# Patient Record
Sex: Male | Born: 2000 | Race: White | Hispanic: No | Marital: Single | Smoking: Never smoker
Health system: Southern US, Community
[De-identification: ages and names within clinical notes are randomized; demographics above are authoritative.]

## PROBLEM LIST (undated history)

## (undated) DIAGNOSIS — J45909 Unspecified asthma, uncomplicated: Secondary | ICD-10-CM

## (undated) HISTORY — PX: TONSILLECTOMY: SUR1361

---
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
## 2021-01-12 DIAGNOSIS — Z5181 Encounter for therapeutic drug level monitoring: Secondary | ICD-10-CM | POA: Diagnosis not present

## 2021-01-12 DIAGNOSIS — U071 COVID-19: Secondary | ICD-10-CM | POA: Diagnosis not present

## 2021-01-12 DIAGNOSIS — Z20828 Contact with and (suspected) exposure to other viral communicable diseases: Secondary | ICD-10-CM | POA: Diagnosis not present

## 2021-01-12 DIAGNOSIS — Z79899 Other long term (current) drug therapy: Secondary | ICD-10-CM | POA: Diagnosis not present

## 2021-01-12 DIAGNOSIS — Z0100 Encounter for examination of eyes and vision without abnormal findings: Secondary | ICD-10-CM | POA: Diagnosis not present

## 2021-01-19 DIAGNOSIS — K51011 Ulcerative (chronic) pancolitis with rectal bleeding: Secondary | ICD-10-CM | POA: Diagnosis not present

## 2021-02-02 DIAGNOSIS — K51011 Ulcerative (chronic) pancolitis with rectal bleeding: Secondary | ICD-10-CM | POA: Diagnosis not present

## 2021-02-07 DIAGNOSIS — K51011 Ulcerative (chronic) pancolitis with rectal bleeding: Secondary | ICD-10-CM | POA: Diagnosis not present

## 2021-02-07 DIAGNOSIS — K529 Noninfective gastroenteritis and colitis, unspecified: Secondary | ICD-10-CM | POA: Diagnosis not present

## 2021-03-02 DIAGNOSIS — K51011 Ulcerative (chronic) pancolitis with rectal bleeding: Secondary | ICD-10-CM | POA: Diagnosis not present

## 2021-03-23 ENCOUNTER — Other Ambulatory Visit (HOSPITAL_COMMUNITY): Payer: Self-pay

## 2021-03-23 DIAGNOSIS — U071 COVID-19: Secondary | ICD-10-CM | POA: Diagnosis not present

## 2021-03-23 DIAGNOSIS — Z20828 Contact with and (suspected) exposure to other viral communicable diseases: Secondary | ICD-10-CM | POA: Diagnosis not present

## 2021-03-23 DIAGNOSIS — Z1159 Encounter for screening for other viral diseases: Secondary | ICD-10-CM | POA: Diagnosis not present

## 2021-03-23 MED ORDER — XOFLUZA (80 MG DOSE) 1 X 80 MG PO TBPK
80.0000 mg | ORAL_TABLET | Freq: Once | ORAL | 0 refills | Status: AC
  Filled 2021-03-23: qty 1, 1d supply, fill #0

## 2021-03-30 DIAGNOSIS — R197 Diarrhea, unspecified: Secondary | ICD-10-CM | POA: Diagnosis not present

## 2021-03-30 DIAGNOSIS — Z68.41 Body mass index (BMI) pediatric, less than 5th percentile for age: Secondary | ICD-10-CM | POA: Diagnosis not present

## 2021-03-30 DIAGNOSIS — K51011 Ulcerative (chronic) pancolitis with rectal bleeding: Secondary | ICD-10-CM | POA: Diagnosis not present

## 2021-04-14 ENCOUNTER — Other Ambulatory Visit (HOSPITAL_COMMUNITY): Payer: Self-pay

## 2021-04-14 DIAGNOSIS — K51011 Ulcerative (chronic) pancolitis with rectal bleeding: Secondary | ICD-10-CM | POA: Diagnosis not present

## 2021-04-14 MED ORDER — AZATHIOPRINE 50 MG PO TABS
50.0000 mg | ORAL_TABLET | Freq: Every day | ORAL | 11 refills | Status: AC
  Filled 2021-04-14: qty 30, 30d supply, fill #0

## 2021-04-19 ENCOUNTER — Other Ambulatory Visit (HOSPITAL_COMMUNITY): Payer: Self-pay

## 2021-04-27 DIAGNOSIS — K51011 Ulcerative (chronic) pancolitis with rectal bleeding: Secondary | ICD-10-CM | POA: Diagnosis not present

## 2021-05-09 DIAGNOSIS — K51011 Ulcerative (chronic) pancolitis with rectal bleeding: Secondary | ICD-10-CM | POA: Diagnosis not present

## 2021-07-07 ENCOUNTER — Other Ambulatory Visit (HOSPITAL_COMMUNITY): Payer: Self-pay

## 2021-07-07 DIAGNOSIS — K51011 Ulcerative (chronic) pancolitis with rectal bleeding: Secondary | ICD-10-CM | POA: Diagnosis not present

## 2021-07-07 DIAGNOSIS — K51019 Ulcerative (chronic) pancolitis with unspecified complications: Secondary | ICD-10-CM | POA: Diagnosis not present

## 2021-07-07 MED ORDER — LOPERAMIDE HCL 2 MG PO CAPS
2.0000 mg | ORAL_CAPSULE | Freq: Four times a day (QID) | ORAL | 0 refills | Status: AC | PRN
  Filled 2021-07-07: qty 30, 8d supply, fill #0

## 2021-07-26 ENCOUNTER — Other Ambulatory Visit (HOSPITAL_COMMUNITY): Payer: Self-pay

## 2021-11-17 ENCOUNTER — Other Ambulatory Visit (HOSPITAL_COMMUNITY): Payer: Self-pay

## 2021-11-17 MED ORDER — BUDESONIDE ER 9 MG PO TB24
9.0000 mg | ORAL_TABLET | Freq: Every day | ORAL | 0 refills | Status: AC
  Filled 2021-11-17: qty 30, 30d supply, fill #0

## 2021-11-20 ENCOUNTER — Other Ambulatory Visit (HOSPITAL_COMMUNITY): Payer: Self-pay

## 2022-04-02 ENCOUNTER — Other Ambulatory Visit: Payer: Self-pay | Admitting: Family

## 2022-04-02 DIAGNOSIS — M25552 Pain in left hip: Secondary | ICD-10-CM

## 2022-04-02 DIAGNOSIS — M1228 Villonodular synovitis (pigmented), other specified site: Secondary | ICD-10-CM

## 2022-04-16 ENCOUNTER — Inpatient Hospital Stay: Admission: RE | Admit: 2022-04-16 | Payer: 59 | Source: Ambulatory Visit

## 2022-05-12 IMAGING — CR DG FOOT COMPLETE 3+V*R*
3 series · 3 of 3 positions shown · non-contrast
Comparison: None.

CLINICAL DATA: 19-year-old male with right foot pain.

EXAM:
RIGHT FOOT COMPLETE - 3+ VIEW

[t foot ap right]
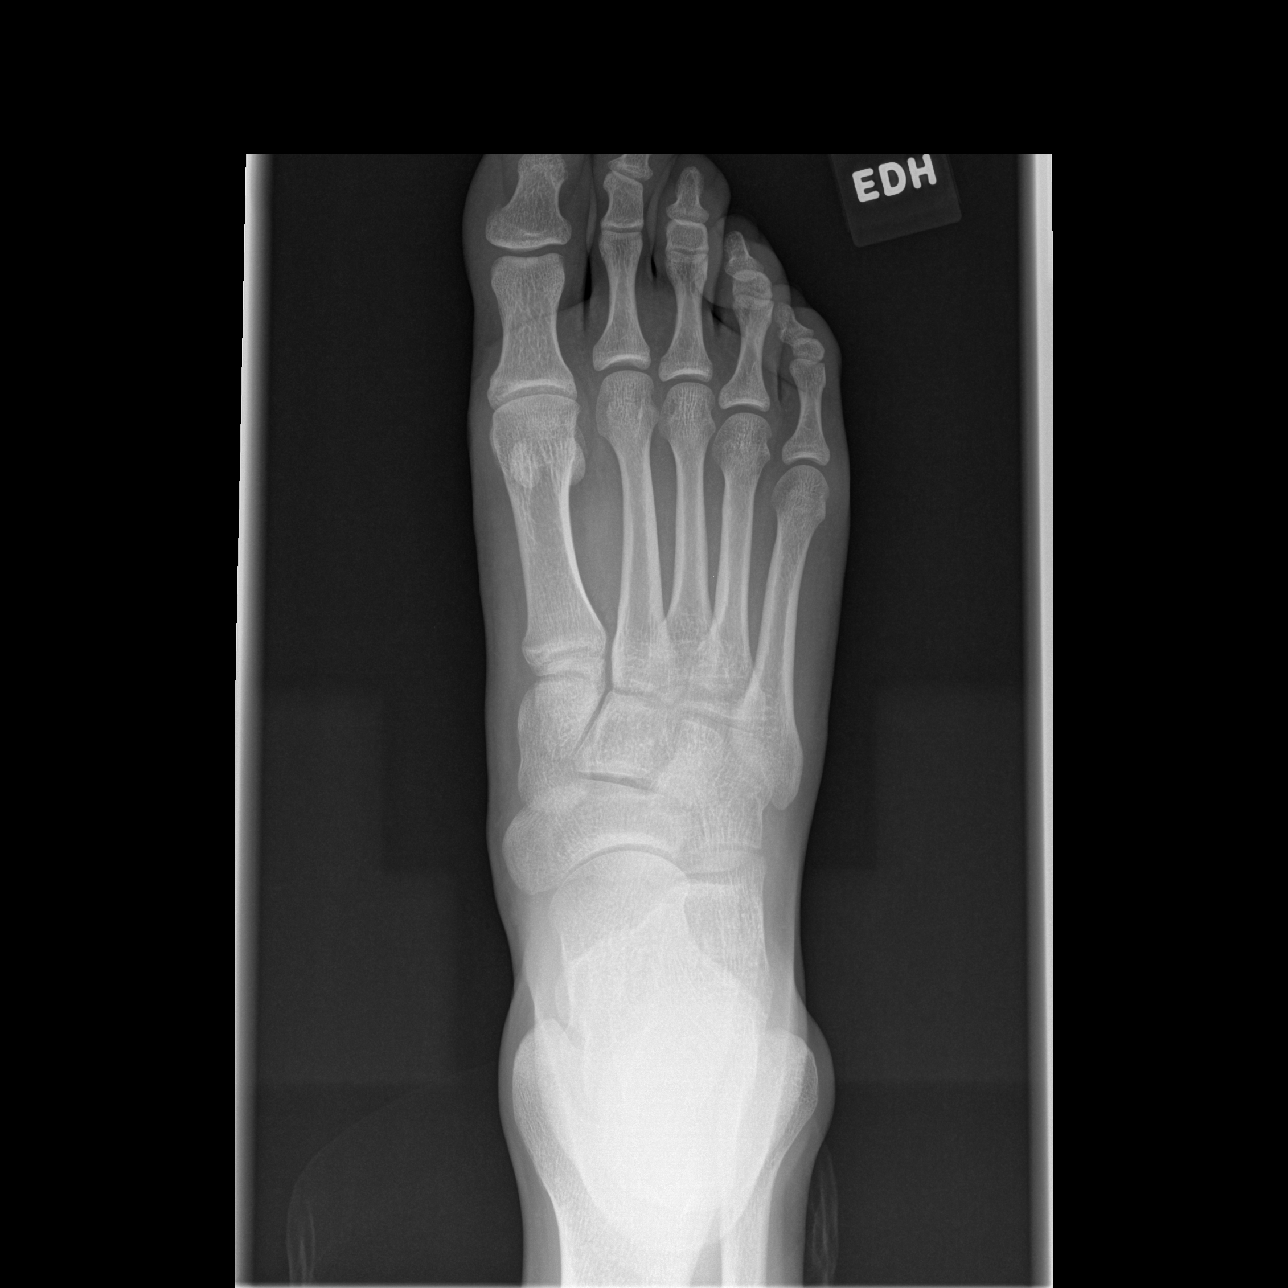

[t foot oblique right]
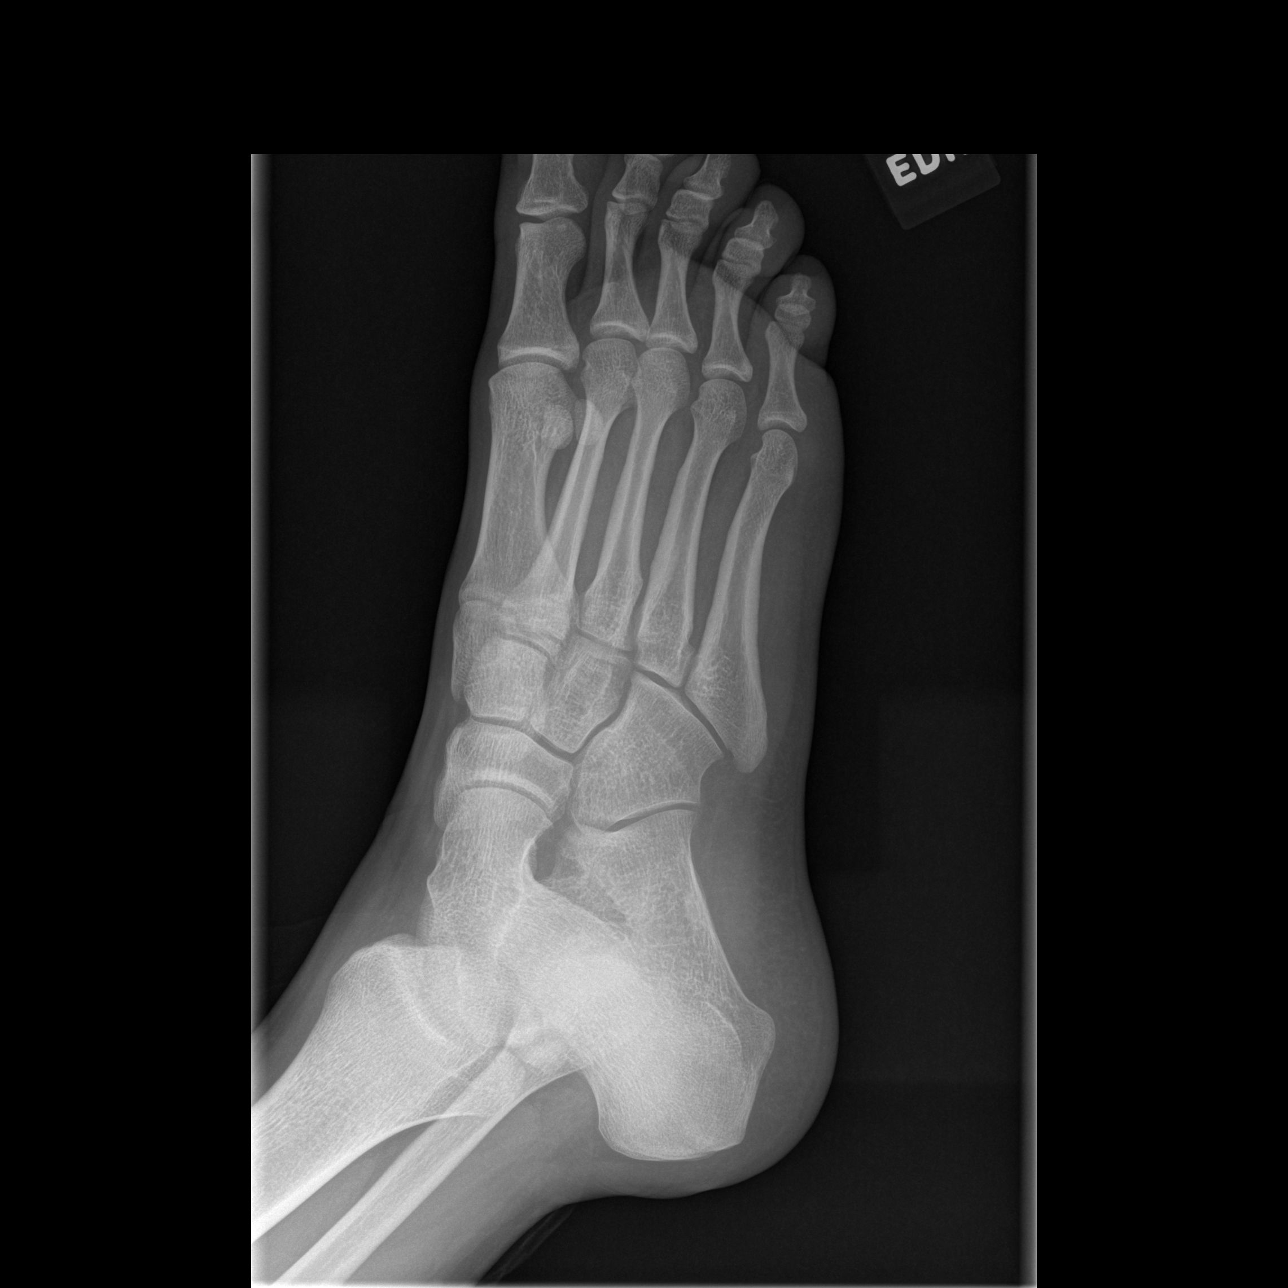

[t foot lat right]
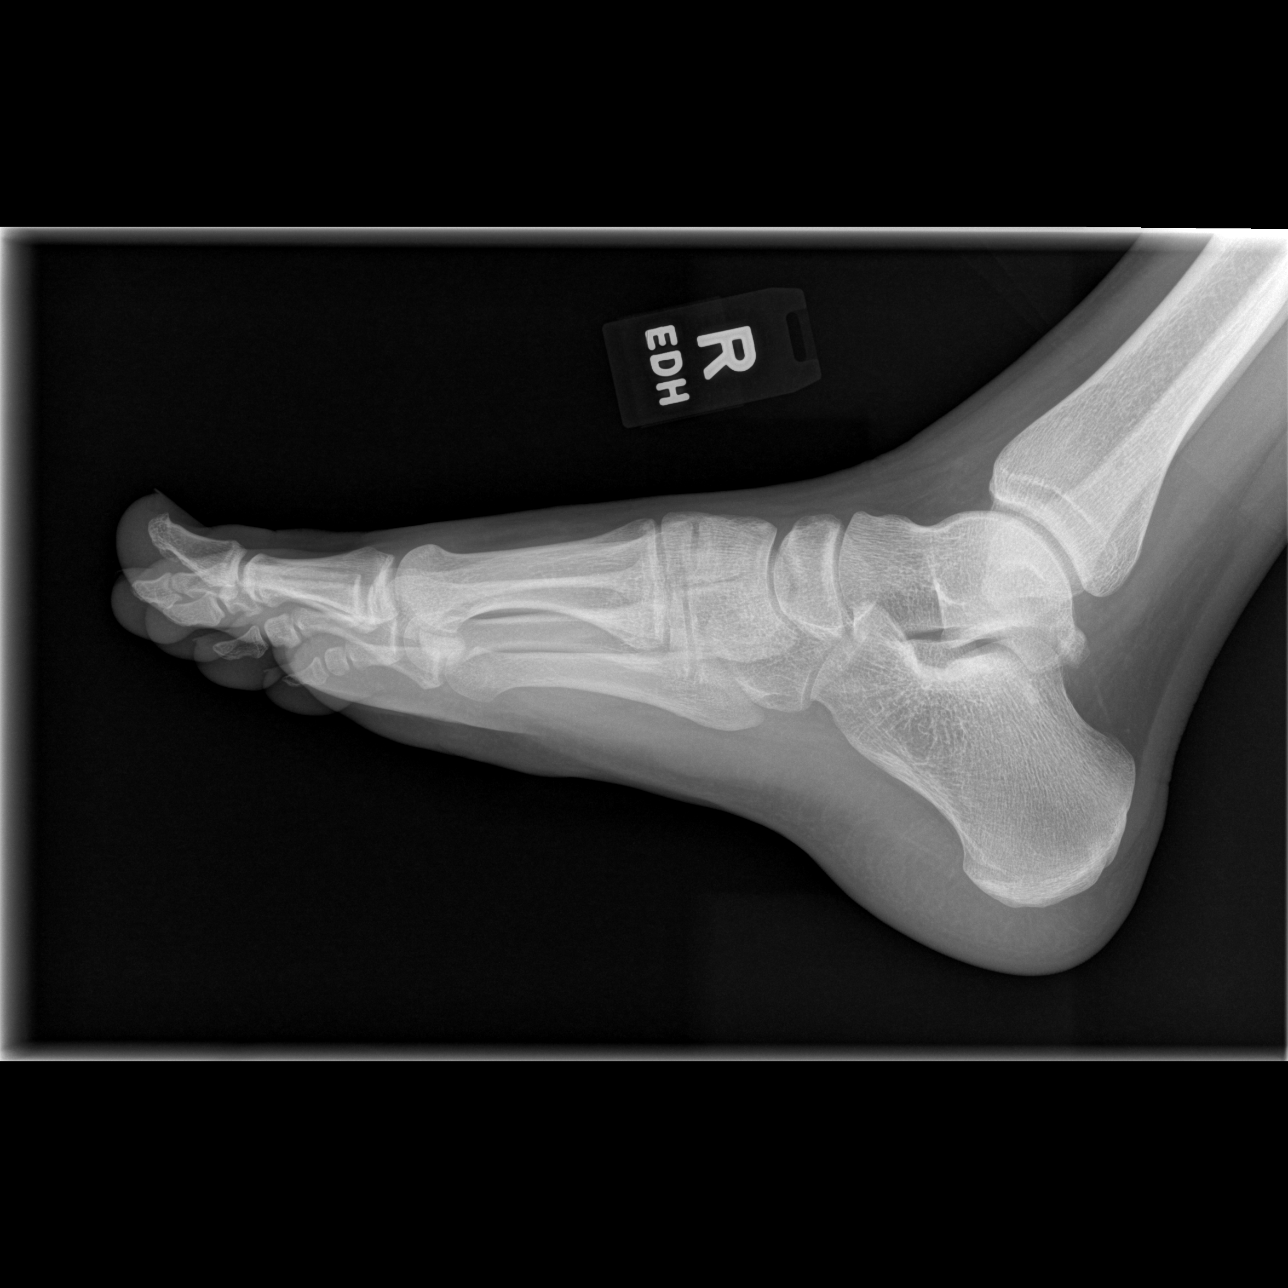

[3 of 3 positions shown; findings below may reference images not displayed]

FINDINGS: There is no evidence of fracture or dislocation. There is no
evidence of arthropathy or other focal bone abnormality. Soft
tissues are unremarkable.
IMPRESSION: Negative.

## 2022-06-03 IMAGING — DX DG CHEST 2V
2 series · 2 of 2 positions shown · non-contrast
Comparison: 01/22/2010

CLINICAL DATA: 19-year-old male with acute respiratory syndrome,
COVID

EXAM:
CHEST - 2 VIEW

[dg chest 2 view (1 of 2)]
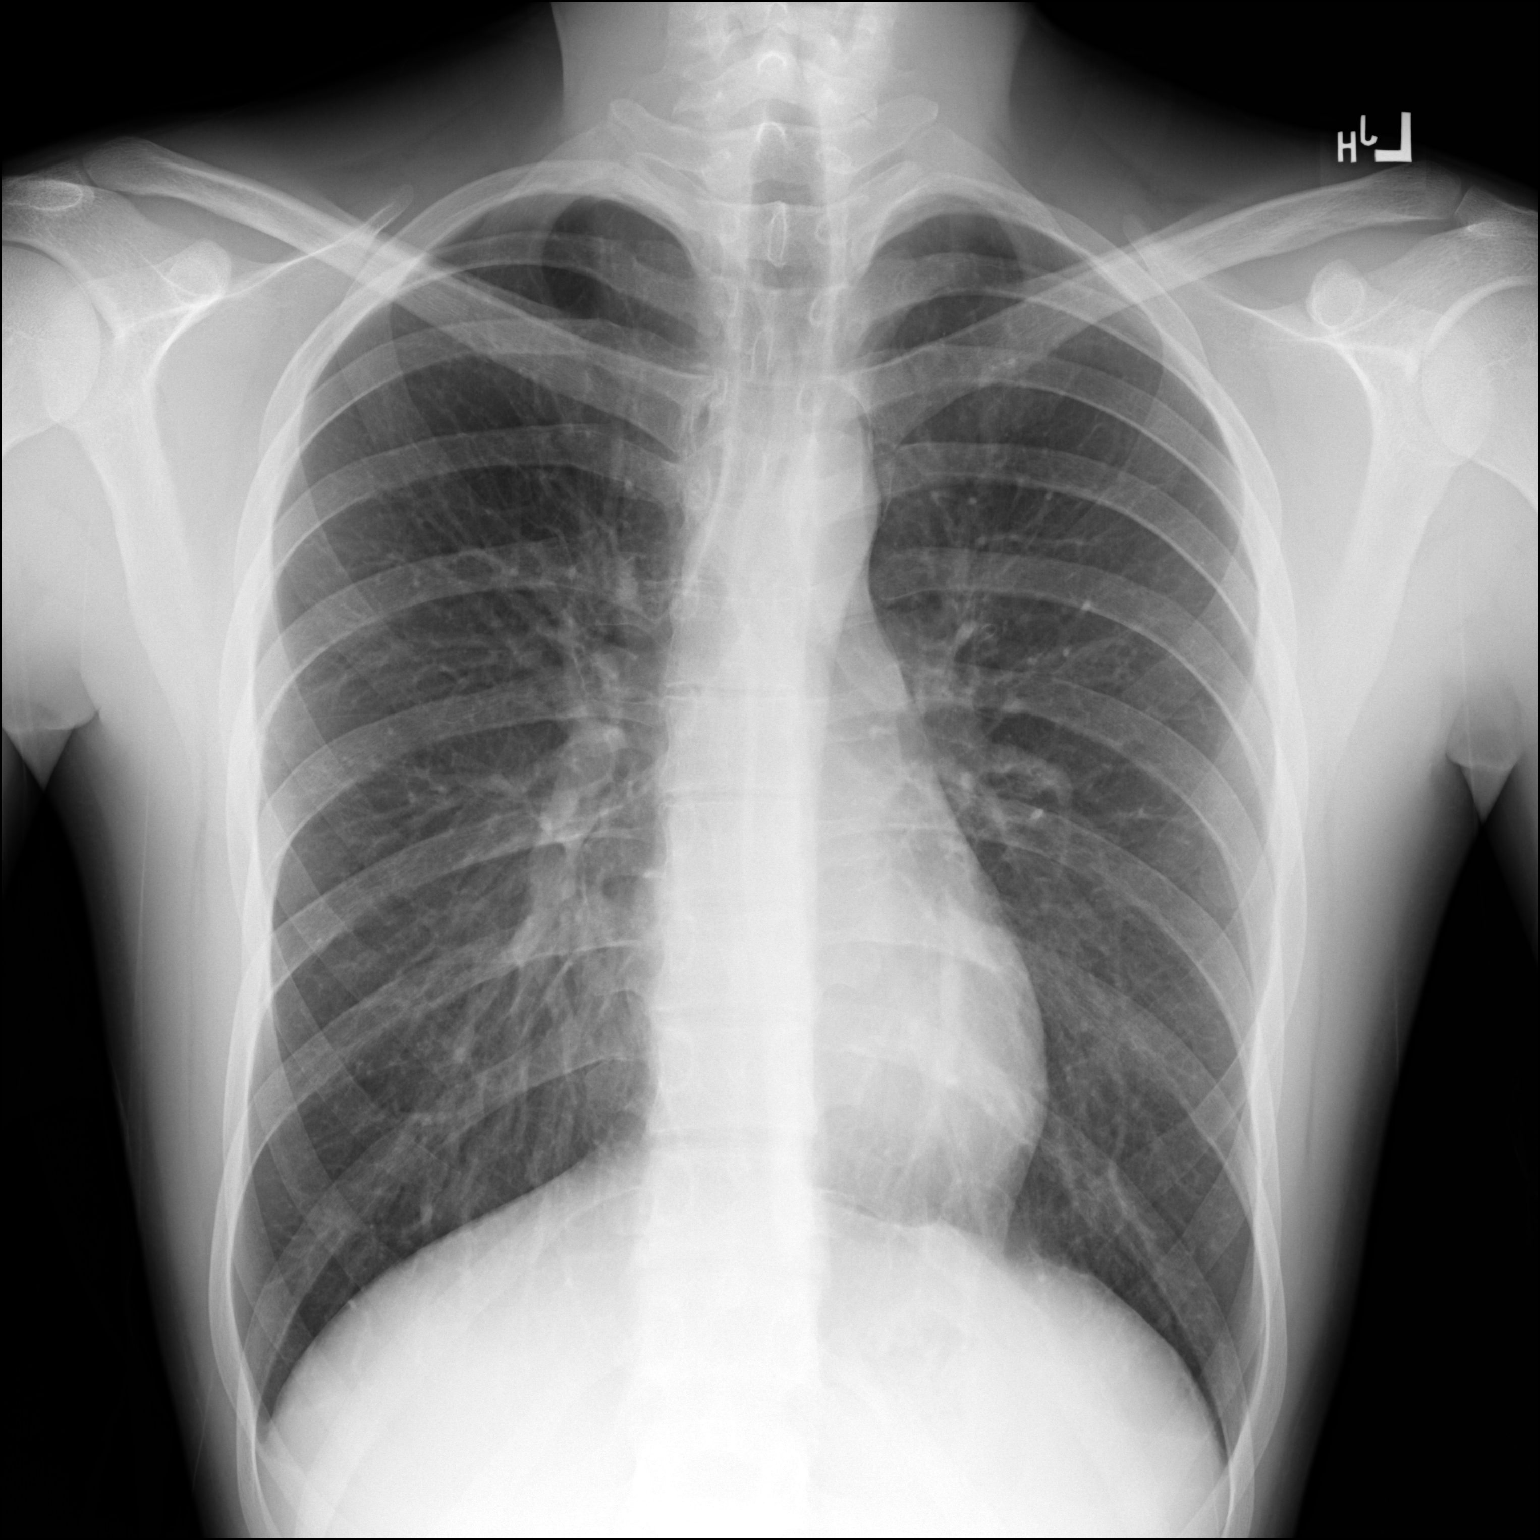

[dg chest 2 view (2 of 2)]
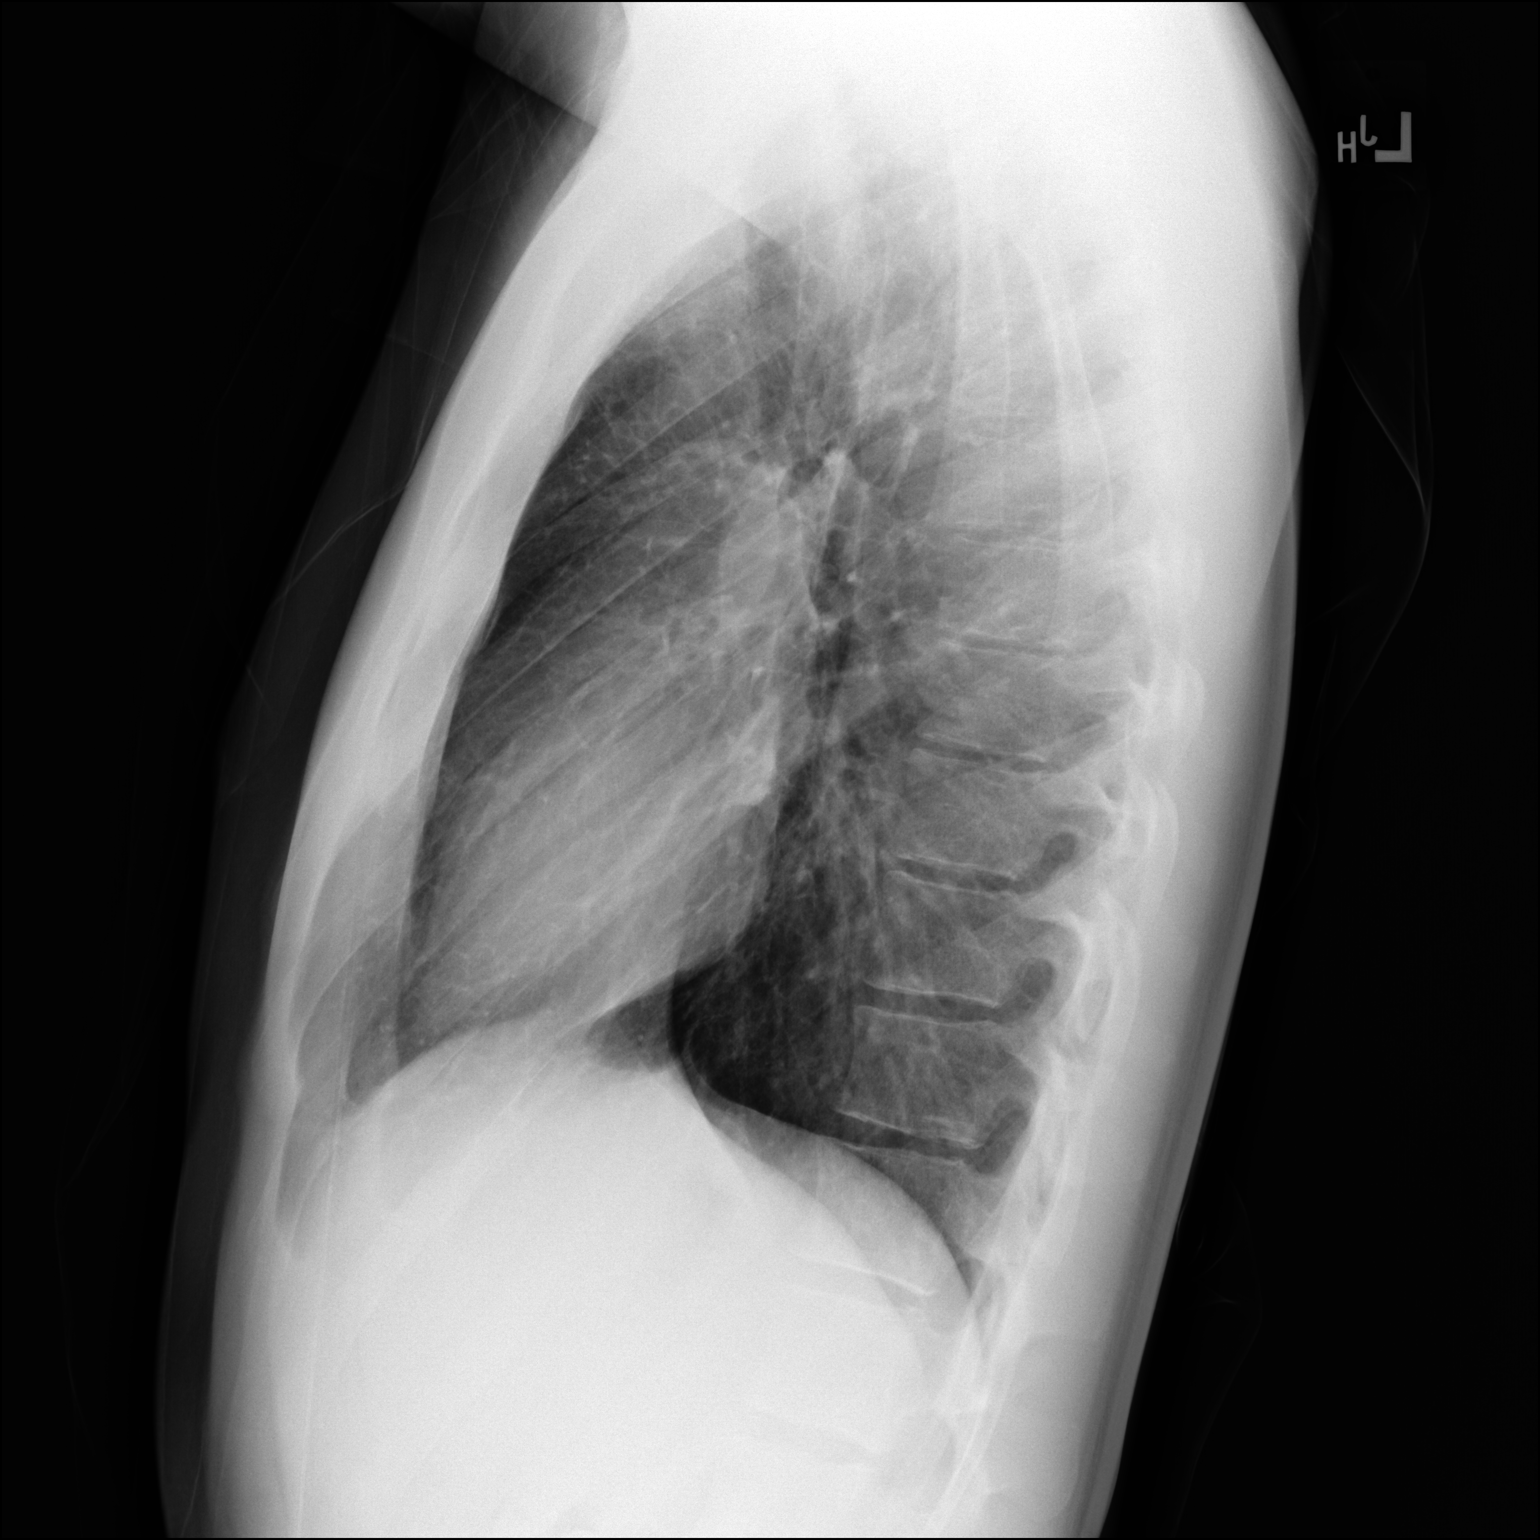

[2 of 2 positions shown; findings below may reference images not displayed]

FINDINGS: The heart size and mediastinal contours are within normal limits.
Both lungs are clear. The visualized skeletal structures are
unremarkable.
IMPRESSION: Negative for acute cardiopulmonary disease

## 2022-08-22 ENCOUNTER — Other Ambulatory Visit (HOSPITAL_COMMUNITY): Payer: Self-pay

## 2022-08-23 ENCOUNTER — Other Ambulatory Visit (HOSPITAL_COMMUNITY): Payer: Self-pay

## 2022-08-23 ENCOUNTER — Telehealth: Payer: Self-pay | Admitting: Pharmacist

## 2022-08-23 ENCOUNTER — Other Ambulatory Visit: Payer: Self-pay

## 2022-08-23 MED ORDER — USTEKINUMAB 90 MG/ML ~~LOC~~ SOSY
PREFILLED_SYRINGE | SUBCUTANEOUS | 10 refills | Status: DC
  Filled 2022-08-23: qty 1, 28d supply, fill #0

## 2022-08-23 NOTE — Telephone Encounter (Signed)
Called patient to schedule an appointment for the [employer] [facility] [employer] [facility]. I was unable to reach the patient so I left a HIPAA-compliant message requesting that the patient return my call.   Luke Van Ausdall, PharmD, BCACP, CPP Clinical Pharmacist [facility] [phone]  

## 2022-08-24 ENCOUNTER — Other Ambulatory Visit (HOSPITAL_COMMUNITY): Payer: Self-pay

## 2022-08-27 ENCOUNTER — Other Ambulatory Visit: Payer: Self-pay

## 2022-08-27 ENCOUNTER — Ambulatory Visit: Payer: Medicaid Other | Attending: Psychiatry | Admitting: Pharmacist

## 2022-08-27 ENCOUNTER — Other Ambulatory Visit (HOSPITAL_COMMUNITY): Payer: Self-pay

## 2022-08-27 DIAGNOSIS — Z7189 Other specified counseling: Secondary | ICD-10-CM

## 2022-08-27 MED ORDER — USTEKINUMAB 90 MG/ML ~~LOC~~ SOSY
PREFILLED_SYRINGE | SUBCUTANEOUS | 10 refills | Status: DC
  Filled 2022-08-27: qty 1, fill #0
  Filled 2022-08-27: qty 1, 28d supply, fill #0
  Filled 2022-09-24: qty 1, 28d supply, fill #1
  Filled 2022-10-16: qty 1, 28d supply, fill #2
  Filled 2022-12-13: qty 1, 28d supply, fill #3
  Filled 2023-01-02: qty 1, 28d supply, fill #4
  Filled 2023-01-29: qty 1, 28d supply, fill #5
  Filled 2023-02-28: qty 1, 28d supply, fill #6
  Filled 2023-03-28: qty 1, 28d supply, fill #7
  Filled 2023-05-06: qty 1, 28d supply, fill #8
  Filled 2023-05-23: qty 1, 28d supply, fill #9
  Filled 2023-06-25: qty 1, 28d supply, fill #10

## 2022-08-27 NOTE — Progress Notes (Signed)
  S: Patient presents for review of their specialty medication therapy.  Patient is currently taking Stelara for UC. Patient is managed by Dr. Nyoka Cowden for this.   Adherence: confirms  Efficacy: working well so far. He has been on several biologic agents before with good initial efficacy but eventual treatment failure.  Dosing:  Maintenance: SubQ: 90 mg every 8 weeks; begin maintenance dosing 8 weeks after the IV induction dose.  Dose adjustments: Renal: no dose adjustments (has not been studied) Hepatic: no dose adjustments (has not been studied) Special populations:  Patients >100 kg: May require higher dose to achieve adequate serum levels.  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  Polysorbate 80: Some dosage forms may contain polysorbate 80 (also known as Tweens). Hypersensitivity reactions, usually a delayed reaction, have been reported following exposure to pharmaceutical products containing polysorbate 80 in certain individuals Dolly Rias, 2002; Lucente 2000; Lollie Marrow, [place]). Thrombocytopenia, ascites, pulmonary deterioration, and renal and hepatic failure have been reported in premature neonates after receiving parenteral products containing polysorbate 80 (Alade, 1986; CDC, 1984). See manufacturer's labeling.   O:     Lab Results  Component Value Date   WBC 9.8 02/10/2020   HGB 16.4 02/10/2020   HCT 46.5 02/10/2020   MCV 89.6 02/10/2020   PLT 355 02/10/2020      Chemistry      Component Value Date/Time   NA 140 02/10/2020 1130   K 3.8 02/10/2020 1130   CL 103 02/10/2020 1130   CO2 26 02/10/2020 1130    BUN 9 02/10/2020 1130   CREATININE 0.84 02/10/2020 1130      Component Value Date/Time   CALCIUM 10.0 02/10/2020 1130   ALKPHOS 76 02/10/2020 1130   AST 19 02/10/2020 1130   ALT 21 02/10/2020 1130   BILITOT 1.7 (H) 02/10/2020 1130       A/P: 1. Medication review: patient currently on Stelara for UC and is tolerating it well. Reviewed the medication with the patient, including the following: Stelara, ustekinumab, is a TNF? blocker. Patient educated on purpose, proper use and potential adverse effects of Stelara.  Following instruction patient verbalized understanding of treatment plan. There is an increased risk of infection and malignancy with this medication. Do not give patients live vaccinations while they are on this medication. Subcutaneous: Administer by subcutaneous injection into the top of the thigh, abdomen, upper arms, or buttocks. Rotate sites. Do not inject into tender, bruised, erythematous, or indurated skin. Avoid areas of skin where psoriasis is present. Discard any unused portion. Intended for use under supervision of physician; self-injection may occur after proper training. If using the single-dose vial, a 1 mL syringe with a 27-gauge 1/2 inch needle is recommended. No recommendations for any changes.  Benard Halsted, PharmD, [facility] [place], [place] [phone]

## 2022-08-28 ENCOUNTER — Other Ambulatory Visit: Payer: Self-pay

## 2022-08-28 ENCOUNTER — Other Ambulatory Visit (HOSPITAL_COMMUNITY): Payer: Self-pay

## 2022-08-29 ENCOUNTER — Other Ambulatory Visit (HOSPITAL_COMMUNITY): Payer: Self-pay

## 2022-09-18 ENCOUNTER — Other Ambulatory Visit (HOSPITAL_COMMUNITY): Payer: Self-pay

## 2022-09-21 ENCOUNTER — Other Ambulatory Visit (HOSPITAL_COMMUNITY): Payer: Self-pay

## 2022-09-24 ENCOUNTER — Other Ambulatory Visit: Payer: Self-pay

## 2022-09-24 ENCOUNTER — Other Ambulatory Visit (HOSPITAL_COMMUNITY): Payer: Self-pay

## 2022-10-16 ENCOUNTER — Other Ambulatory Visit (HOSPITAL_COMMUNITY): Payer: Self-pay

## 2022-10-19 ENCOUNTER — Other Ambulatory Visit (HOSPITAL_COMMUNITY): Payer: Self-pay

## 2022-10-26 DIAGNOSIS — K51011 Ulcerative (chronic) pancolitis with rectal bleeding: Secondary | ICD-10-CM | POA: Diagnosis not present

## 2022-11-13 ENCOUNTER — Other Ambulatory Visit (HOSPITAL_COMMUNITY): Payer: Self-pay

## 2022-11-16 ENCOUNTER — Other Ambulatory Visit (HOSPITAL_COMMUNITY): Payer: Self-pay

## 2022-11-19 ENCOUNTER — Other Ambulatory Visit (HOSPITAL_COMMUNITY): Payer: Self-pay

## 2022-12-04 ENCOUNTER — Other Ambulatory Visit: Payer: Self-pay

## 2022-12-13 ENCOUNTER — Other Ambulatory Visit (HOSPITAL_COMMUNITY): Payer: Self-pay

## 2022-12-14 ENCOUNTER — Other Ambulatory Visit (HOSPITAL_COMMUNITY): Payer: Self-pay

## 2023-01-02 ENCOUNTER — Other Ambulatory Visit (HOSPITAL_COMMUNITY): Payer: Self-pay

## 2023-01-04 ENCOUNTER — Other Ambulatory Visit (HOSPITAL_COMMUNITY): Payer: Self-pay

## 2023-01-29 ENCOUNTER — Other Ambulatory Visit (HOSPITAL_COMMUNITY): Payer: Self-pay

## 2023-02-16 ENCOUNTER — Encounter (HOSPITAL_COMMUNITY): Payer: Self-pay

## 2023-02-28 ENCOUNTER — Other Ambulatory Visit: Payer: Self-pay

## 2023-02-28 NOTE — Progress Notes (Signed)
Specialty Pharmacy Refill Coordination Note  Sanders Manninen is a 22 y.o. male contacted today regarding refills of specialty medication(s) Ustekinumab (Antipsoriatics)   Patient requested Delivery   Delivery date: 03/07/23   Verified address: [address] [place] [address]   Medication will be filled on 03/06/23.

## 2023-03-05 DIAGNOSIS — E559 Vitamin D deficiency, unspecified: Secondary | ICD-10-CM | POA: Diagnosis not present

## 2023-03-05 DIAGNOSIS — Z13220 Encounter for screening for lipoid disorders: Secondary | ICD-10-CM | POA: Diagnosis not present

## 2023-03-05 DIAGNOSIS — U070 Vaping-related disorder: Secondary | ICD-10-CM | POA: Diagnosis not present

## 2023-03-15 DIAGNOSIS — K51011 Ulcerative (chronic) pancolitis with rectal bleeding: Secondary | ICD-10-CM | POA: Diagnosis not present

## 2023-03-28 ENCOUNTER — Other Ambulatory Visit: Payer: Self-pay

## 2023-03-28 NOTE — Progress Notes (Signed)
Specialty Pharmacy Refill Coordination Note  Jeffrey Andrade is a 22 y.o. male contacted today regarding refills of specialty medication(s) Ustekinumab (Antipsoriatics)   Patient requested Delivery   Delivery date: 04/03/23   Verified address: [address] [place] [address]   Medication will be filled on 04/02/23.

## 2023-04-24 ENCOUNTER — Other Ambulatory Visit: Payer: Self-pay

## 2023-04-29 ENCOUNTER — Other Ambulatory Visit: Payer: Self-pay

## 2023-05-01 ENCOUNTER — Other Ambulatory Visit: Payer: Self-pay

## 2023-05-06 ENCOUNTER — Other Ambulatory Visit (HOSPITAL_COMMUNITY): Payer: Self-pay | Admitting: Pharmacy Technician

## 2023-05-06 ENCOUNTER — Other Ambulatory Visit: Payer: Self-pay

## 2023-05-06 ENCOUNTER — Other Ambulatory Visit (HOSPITAL_COMMUNITY): Payer: Self-pay

## 2023-05-06 NOTE — Progress Notes (Signed)
Specialty Pharmacy Refill Coordination Note  Jeffrey Andrade is a 22 y.o. male contacted today regarding refills of specialty medication(s) Ustekinumab (Antipsoriatics)  Spoke with Mom   Patient requested Delivery   Delivery date: 05/07/23   Verified address: [address]  [place]   Medication will be filled on 05/06/23.

## 2023-05-23 ENCOUNTER — Other Ambulatory Visit (HOSPITAL_COMMUNITY): Payer: Self-pay

## 2023-05-23 ENCOUNTER — Encounter (HOSPITAL_COMMUNITY): Payer: Self-pay

## 2023-05-23 NOTE — Progress Notes (Signed)
Specialty Pharmacy Refill Coordination Note  Jeffrey Andrade is a 22 y.o. male contacted today regarding refills of specialty medication(s) Ustekinumab Jeffrey Andrade)   Patient requested Delivery   Delivery date: 06/04/23   Verified address: [address] [place], [place] [address]   Medication will be filled on 06/03/23.

## 2023-06-03 ENCOUNTER — Other Ambulatory Visit: Payer: Self-pay

## 2023-06-25 ENCOUNTER — Other Ambulatory Visit (HOSPITAL_COMMUNITY): Payer: Self-pay | Admitting: Pharmacy Technician

## 2023-06-25 ENCOUNTER — Other Ambulatory Visit (HOSPITAL_COMMUNITY): Payer: Self-pay

## 2023-06-25 NOTE — Progress Notes (Signed)
Specialty Pharmacy Refill Coordination Note  Jeffrey Andrade is a 23 y.o. male contacted today regarding refills of specialty medication(s) Ustekinumab [name])  Spoke with Mom   Patient requested Delivery   Delivery date: 06/28/23   Verified address: [address]  [place]   Medication will be filled on 06/27/23.

## 2023-06-27 ENCOUNTER — Other Ambulatory Visit: Payer: Self-pay

## 2023-07-23 ENCOUNTER — Other Ambulatory Visit: Payer: Self-pay

## 2023-07-23 NOTE — Progress Notes (Signed)
 Specialty Pharmacy Ongoing Clinical Assessment Note  Jeffrey Andrade is a 23 y.o. male who is being followed by the specialty pharmacy service for RxSp Ulcerative Colitis   Patient's specialty medication(s) reviewed today: Ustekinumab (STELARA)   Missed doses in the last 4 weeks: 0   Patient/Caregiver did not have any additional questions or concerns.   Therapeutic benefit summary: Patient is achieving benefit   Adverse events/side effects summary: No adverse events/side effects   Patient's therapy is appropriate to: Continue    Goals Addressed             This Visit's Progress    Minimize recurrence of flares       Patient is on track. Patient will maintain adherence         Follow up:  6 months  Otto Herb Specialty Pharmacist

## 2023-07-23 NOTE — Progress Notes (Signed)
 Specialty Pharmacy Refill Coordination Note  Jeffrey Andrade is a 23 y.o. male contacted today regarding refills of specialty medication(s) Ustekinumab [name])   Patient requested Delivery   Delivery date: 07/26/23   Verified address: [address]  [place]   Medication will be filled on 07/25/23.   This fill date is pending response to refill request from provider. Patient is aware and if they have not received fill by intended date they must follow up with pharmacy.

## 2023-07-25 ENCOUNTER — Other Ambulatory Visit: Payer: Self-pay

## 2023-07-26 ENCOUNTER — Other Ambulatory Visit: Payer: Self-pay | Admitting: Pharmacist

## 2023-07-26 ENCOUNTER — Other Ambulatory Visit: Payer: Self-pay

## 2023-07-26 MED ORDER — STELARA 90 MG/ML ~~LOC~~ SOSY
PREFILLED_SYRINGE | SUBCUTANEOUS | 5 refills | Status: DC
  Filled 2023-07-26 – 2023-08-20 (×2): qty 1, 28d supply, fill #0
  Filled 2023-09-13: qty 1, 28d supply, fill #1
  Filled 2023-10-10: qty 1, 28d supply, fill #2
  Filled 2023-11-11: qty 1, 28d supply, fill #3
  Filled 2023-12-06: qty 1, 28d supply, fill #4
  Filled 2024-01-06: qty 1, 28d supply, fill #5
  Filled 2024-01-31: qty 1, 28d supply, fill #6
  Filled 2024-02-27: qty 1, 28d supply, fill #7
  Filled 2024-03-06: qty 1, 28d supply, fill #8
  Filled 2024-03-27 – 2024-04-06 (×2): qty 1, 28d supply, fill #9
  Filled 2024-04-28: qty 1, 28d supply, fill #10
  Filled 2024-05-20: qty 1, 28d supply, fill #11

## 2023-07-26 MED ORDER — STELARA 90 MG/ML ~~LOC~~ SOSY: PREFILLED_SYRINGE | SUBCUTANEOUS | 5 refills | Status: DC

## 2023-07-29 ENCOUNTER — Other Ambulatory Visit: Payer: Self-pay

## 2023-07-30 ENCOUNTER — Other Ambulatory Visit: Payer: Self-pay

## 2023-08-02 ENCOUNTER — Other Ambulatory Visit: Payer: Self-pay

## 2023-08-02 ENCOUNTER — Other Ambulatory Visit (HOSPITAL_COMMUNITY): Payer: Self-pay

## 2023-08-05 ENCOUNTER — Other Ambulatory Visit: Payer: Self-pay

## 2023-08-05 ENCOUNTER — Other Ambulatory Visit (HOSPITAL_COMMUNITY): Payer: Self-pay

## 2023-08-06 ENCOUNTER — Other Ambulatory Visit: Payer: Self-pay

## 2023-08-09 ENCOUNTER — Other Ambulatory Visit (HOSPITAL_COMMUNITY): Payer: Self-pay

## 2023-08-14 ENCOUNTER — Other Ambulatory Visit: Payer: Self-pay

## 2023-08-15 ENCOUNTER — Other Ambulatory Visit: Payer: Self-pay

## 2023-08-19 ENCOUNTER — Other Ambulatory Visit: Payer: Self-pay

## 2023-08-19 ENCOUNTER — Other Ambulatory Visit (HOSPITAL_COMMUNITY): Payer: Self-pay

## 2023-08-19 NOTE — Progress Notes (Signed)
 08/19/23- LVM for Mom At [phone]. Rx has been ready for 2 weeks & with no update about Stelara CC.  PPU 3/10 Patient request to PPU from [facility] [place]; CC info send to [facility] also advise patient to bring with him to Pick up  manu. debit card not working, called patient and confirmed debit card details and still not working when i tried to reprocess. pt following up with stelara

## 2023-08-20 ENCOUNTER — Other Ambulatory Visit (HOSPITAL_COMMUNITY): Payer: Self-pay

## 2023-08-20 ENCOUNTER — Other Ambulatory Visit: Payer: Self-pay

## 2023-08-20 NOTE — Progress Notes (Signed)
 08/20/23- Called Stelara & Card only covers [phone] & patient is responsible for $5.00 copay. Voicemail has been left for patient & need Personal CC to charge $5.00.

## 2023-08-21 ENCOUNTER — Other Ambulatory Visit: Payer: Self-pay

## 2023-08-23 ENCOUNTER — Other Ambulatory Visit (HOSPITAL_COMMUNITY): Payer: Self-pay

## 2023-08-26 ENCOUNTER — Other Ambulatory Visit: Payer: Self-pay

## 2023-08-26 ENCOUNTER — Other Ambulatory Visit (HOSPITAL_COMMUNITY): Payer: Self-pay

## 2023-08-29 ENCOUNTER — Other Ambulatory Visit (HOSPITAL_COMMUNITY): Payer: Self-pay

## 2023-08-30 ENCOUNTER — Other Ambulatory Visit (HOSPITAL_COMMUNITY): Payer: Self-pay

## 2023-08-30 ENCOUNTER — Other Ambulatory Visit: Payer: Self-pay

## 2023-09-02 ENCOUNTER — Other Ambulatory Visit (HOSPITAL_COMMUNITY): Payer: Self-pay

## 2023-09-06 ENCOUNTER — Other Ambulatory Visit (HOSPITAL_COMMUNITY): Payer: Self-pay

## 2023-09-09 ENCOUNTER — Ambulatory Visit: Attending: Internal Medicine | Admitting: Pharmacist

## 2023-09-09 DIAGNOSIS — Z7189 Other specified counseling: Secondary | ICD-10-CM

## 2023-09-09 NOTE — Progress Notes (Signed)
  S: Patient presents for review of their specialty medication therapy.  Patient is currently taking Stelara for UC. Patient is managed by Dr. Marrie Sizer for this.   Adherence: confirms  Efficacy: working well for him  Dosing:  Maintenance: SubQ: 90 mg every 8 weeks  Dose adjustments: Renal: no dose adjustments (has not been studied) Hepatic: no dose adjustments (has not been studied) Special populations:  Patients >100 kg: May require higher dose to achieve adequate serum levels.  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  Polysorbate 80: Some dosage forms may contain polysorbate 80 (also known as Tweens). Hypersensitivity reactions, usually a delayed reaction, have been reported following exposure to pharmaceutical products containing polysorbate 80 in certain individuals Jeffrey Andrade, 2002; Lucente 2000; [name], [place]). Thrombocytopenia, ascites, pulmonary deterioration, and renal and hepatic failure have been reported in premature neonates after receiving parenteral products containing polysorbate 80 (Alade, 1986; CDC, 1984). See manufacturer's labeling.   O:     Lab Results  Component Value Date   WBC 9.8 02/10/2020   HGB 16.4 02/10/2020   HCT 46.5 02/10/2020   MCV 89.6 02/10/2020   PLT 355 02/10/2020      Chemistry      Component Value Date/Time   NA 140 02/10/2020 1130   K 3.8 02/10/2020 1130   CL 103 02/10/2020 1130   CO2 26 02/10/2020 1130   BUN 9 02/10/2020 1130   CREATININE 0.84 02/10/2020 1130      Component Value Date/Time   CALCIUM 10.0 02/10/2020 1130   ALKPHOS 76 02/10/2020 1130   AST 19  02/10/2020 1130   ALT 21 02/10/2020 1130   BILITOT 1.7 (H) 02/10/2020 1130       A/P: 1. Medication review: patient currently on Stelara for UC and is tolerating it well. Reviewed the medication with the patient, including the following: Stelara, ustekinumab, is a TNF? blocker. Patient educated on purpose, proper use and potential adverse effects of Stelara.  Following instruction patient verbalized understanding of treatment plan. There is an increased risk of infection and malignancy with this medication. Do not give patients live vaccinations while they are on this medication. Subcutaneous: Administer by subcutaneous injection into the top of the thigh, abdomen, upper arms, or buttocks. Rotate sites. Do not inject into tender, bruised, erythematous, or indurated skin. Avoid areas of skin where psoriasis is present. Discard any unused portion. Intended for use under supervision of physician; self-injection may occur after proper training. If using the single-dose vial, a 1 mL syringe with a 27-gauge 1/2 inch needle is recommended. No recommendations for any changes.  [name], PharmD, Becky Bowels, CPP Clinical Pharmacist [facility] [phone]

## 2023-09-11 ENCOUNTER — Other Ambulatory Visit: Payer: Self-pay

## 2023-09-13 ENCOUNTER — Other Ambulatory Visit: Payer: Self-pay

## 2023-09-13 NOTE — Progress Notes (Signed)
 Specialty Pharmacy Refill Coordination Note  Jeffrey Andrade is a 23 y.o. male contacted today regarding refills of specialty medication(s) Ustekinumab  (Stelara )   Patient requested Delivery   Delivery date: 09/18/23   Verified address: [address] [place], [address]   Medication will be filled on 09/17/23.

## 2023-09-17 ENCOUNTER — Other Ambulatory Visit: Payer: Self-pay

## 2023-09-17 NOTE — Progress Notes (Signed)
 Pharmacy Patient Advocate Encounter   Received notification from Patient Pharmacy that prior authorization for Stelara  is required/requested.   Insurance verification completed.   The patient is insured through [facility] .   Per test claim: PA required; PA submitted to above mentioned insurance via CoverMyMeds Key/confirmation #/EOC [id number] Status is pending

## 2023-09-17 NOTE — Progress Notes (Signed)
PA submitted and pt notified

## 2023-09-18 ENCOUNTER — Other Ambulatory Visit: Payer: Self-pay

## 2023-09-18 NOTE — Progress Notes (Signed)
 PA approved and fill initiated. Pt updated

## 2023-09-18 NOTE — Progress Notes (Signed)
 Pharmacy Patient Advocate Encounter  Received notification from [facility] that Prior Authorization for Stelara  has been APPROVED from 09/18/23 to 09/16/24   PA #/Case ID/Reference #:  [id number]

## 2023-09-20 ENCOUNTER — Other Ambulatory Visit: Payer: Self-pay

## 2023-09-23 ENCOUNTER — Other Ambulatory Visit (HOSPITAL_COMMUNITY): Payer: Self-pay

## 2023-10-08 ENCOUNTER — Other Ambulatory Visit: Payer: Self-pay

## 2023-10-09 ENCOUNTER — Other Ambulatory Visit (HOSPITAL_COMMUNITY): Payer: Self-pay

## 2023-10-10 ENCOUNTER — Other Ambulatory Visit: Payer: Self-pay | Admitting: Pharmacy Technician

## 2023-10-10 ENCOUNTER — Other Ambulatory Visit: Payer: Self-pay

## 2023-10-10 NOTE — Progress Notes (Signed)
 Specialty Pharmacy Refill Coordination Note  Jeffrey Andrade is a 23 y.o. male contacted today regarding refills of specialty medication(s) Ustekinumab  (Stelara )   Patient requested Delivery   Delivery date: 10/17/23   Verified address: [address]  [place]   Medication will be filled on 10/16/23.

## 2023-10-16 ENCOUNTER — Other Ambulatory Visit: Payer: Self-pay

## 2023-10-17 ENCOUNTER — Ambulatory Visit: Payer: Medicaid Other | Admitting: Dermatology

## 2023-10-23 ENCOUNTER — Ambulatory Visit (INDEPENDENT_AMBULATORY_CARE_PROVIDER_SITE_OTHER): Payer: Self-pay | Admitting: Physician Assistant

## 2023-10-23 DIAGNOSIS — Z91199 Patient's noncompliance with other medical treatment and regimen due to unspecified reason: Secondary | ICD-10-CM

## 2023-10-23 NOTE — Progress Notes (Signed)
 Patient no-showed today's appointment.

## 2023-11-08 ENCOUNTER — Other Ambulatory Visit: Payer: Self-pay

## 2023-11-11 ENCOUNTER — Other Ambulatory Visit: Payer: Self-pay

## 2023-11-11 ENCOUNTER — Other Ambulatory Visit: Payer: Self-pay | Admitting: Pharmacy Technician

## 2023-11-11 NOTE — Progress Notes (Signed)
 Specialty Pharmacy Refill Coordination Note  Jeffrey Andrade is a 23 y.o. male contacted today regarding refills of specialty medication(s) Ustekinumab  (Stelara )   Patient requested Delivery   Delivery date: 11/13/23   Verified address: [address]  [place] [address]   Medication will be filled on 11/12/23.

## 2023-12-06 ENCOUNTER — Other Ambulatory Visit: Payer: Self-pay

## 2023-12-09 ENCOUNTER — Other Ambulatory Visit: Payer: Self-pay | Admitting: Pharmacy Technician

## 2023-12-09 ENCOUNTER — Other Ambulatory Visit: Payer: Self-pay

## 2023-12-09 NOTE — Progress Notes (Signed)
 Specialty Pharmacy Refill Coordination Note  Jeffrey Andrade is a 23 y.o. male contacted today regarding refills of specialty medication(s) Ustekinumab  (Stelara )  Spoke with Mom  Patient requested Delivery   Delivery date: 12/13/23   Verified address: [address]  [name]   Medication will be filled on 12/12/23.

## 2023-12-11 ENCOUNTER — Other Ambulatory Visit: Payer: Self-pay

## 2023-12-23 ENCOUNTER — Other Ambulatory Visit (HOSPITAL_COMMUNITY): Payer: Self-pay

## 2024-01-06 ENCOUNTER — Other Ambulatory Visit: Payer: Self-pay

## 2024-01-07 ENCOUNTER — Other Ambulatory Visit: Payer: Self-pay

## 2024-01-07 NOTE — Progress Notes (Signed)
 Specialty Pharmacy Refill Coordination Note  Jeffrey Andrade is a 23 y.o. male contacted today regarding refills of specialty medication(s) Ustekinumab  (Stelara )   Patient requested Delivery   Delivery date: 01/09/24   Verified address: [address]  [place]   Medication will be filled on 01/08/24.

## 2024-01-09 DIAGNOSIS — K51011 Ulcerative (chronic) pancolitis with rectal bleeding: Secondary | ICD-10-CM | POA: Diagnosis not present

## 2024-01-15 ENCOUNTER — Other Ambulatory Visit: Payer: Self-pay

## 2024-01-15 NOTE — Progress Notes (Addendum)
 Patient called in stating that their medication was delivered to their front door rather than their side door as usual and medication sat for 24 hrs before med was brought inside. [place], [facility], determined the medication was still fit and safe for use and patient was informed of this determination. New delivery note to be placed in emporos to place packages in front of garage door per patients request.

## 2024-01-29 ENCOUNTER — Other Ambulatory Visit (HOSPITAL_COMMUNITY): Payer: Self-pay

## 2024-01-31 ENCOUNTER — Other Ambulatory Visit: Payer: Self-pay

## 2024-01-31 NOTE — Progress Notes (Signed)
 Specialty Pharmacy Refill Coordination Note  Jeffrey Andrade is a 23 y.o. male contacted today regarding refills of specialty medication(s) Ustekinumab  (Stelara )   Patient requested Delivery   Delivery date: 02/06/24   Verified address: [address]  [place]   Medication will be filled on 09.10.25.

## 2024-02-03 ENCOUNTER — Other Ambulatory Visit: Payer: Self-pay

## 2024-02-03 NOTE — Progress Notes (Signed)
 Patient called back requesting earlier delivery date and wants medication delivered on 9.10.25

## 2024-02-04 ENCOUNTER — Other Ambulatory Visit: Payer: Self-pay

## 2024-02-13 DIAGNOSIS — K51011 Ulcerative (chronic) pancolitis with rectal bleeding: Secondary | ICD-10-CM | POA: Diagnosis not present

## 2024-02-25 ENCOUNTER — Other Ambulatory Visit: Payer: Self-pay

## 2024-02-27 ENCOUNTER — Other Ambulatory Visit: Payer: Self-pay

## 2024-02-27 NOTE — Progress Notes (Signed)
 Specialty Pharmacy Refill Coordination Note  Jeffrey Andrade is a 23 y.o. male contacted today regarding refills of specialty medication(s) Ustekinumab  (Stelara )   Patient requested Delivery   Delivery date: 03/03/24   Verified address: [address]  [place]   Medication will be filled on 03/02/24.

## 2024-02-28 ENCOUNTER — Other Ambulatory Visit: Payer: Self-pay

## 2024-03-06 ENCOUNTER — Other Ambulatory Visit: Payer: Self-pay

## 2024-03-06 ENCOUNTER — Other Ambulatory Visit (HOSPITAL_COMMUNITY): Payer: Self-pay

## 2024-03-06 NOTE — Progress Notes (Signed)
 Clinical Intervention Note  Clinical Intervention Notes: Patient's girlfriend [place]ique called in stating that package was left on front porch by [employer] but that they had asked for notes for package to be left in alternate location. She states that she was watching for the package on 10/7 but did not see it until last night 10/9. [employer] tracking shows package as delivered on 10/7 at 10:56am. Advised that package is safe for use up to temperature excursion of 86 degrees for up to 30 days. Girlfriend states that in the past they had a similiar delivery issue and that we advised the same and patient did inject but had a flare soon after so they believe the medication was not effective that month. As external temperature was not 86 degrees recently and medication did remain inside of shipment packaging, I advised that it is still fit for use. She did not wish to have patient use this medication due to the previous flare and is requesting replacement product. Advised that we cannot directly replace but we may be able to obtain override from insurance for damaged medication. During the time I was attempting to obtain override, patient mother Bascom called in. She stated the same that she will not allow patient to inject this dose as it was left out in the sun for days. Reminded that patient was aware of delivery date and that we were not notified that package had not been received on the day of delivery but that I would proceed in attempting to obtain override for additional dose to be dispensed. Override was approved by [employer] and medication will be filled today for pickup. Patient mother has been made aware and will arrange to have medication picked up. Advised for future we must dispense for pickup due to multiple delivery issues.   Clinical Intervention Outcomes: Improved therapy effectiveness   Delon CHRISTELLA [facility] Specialty Pharmacist

## 2024-03-06 NOTE — Progress Notes (Signed)
 Specialty Pharmacy Refill Coordination Note  Jeffrey Andrade is a 23 y.o. male contacted today regarding refills of specialty medication(s) Ustekinumab  (Stelara )   Patient requested [name] at [facility] at [place] date: 03/06/24   Medication will be filled on 03/06/24.   Medication filled with damaged medication override from [employer]. Patient is eligible for 1 override per medication per 6 months per representative.

## 2024-03-26 ENCOUNTER — Other Ambulatory Visit: Payer: Self-pay

## 2024-03-27 ENCOUNTER — Other Ambulatory Visit: Payer: Self-pay

## 2024-03-31 ENCOUNTER — Other Ambulatory Visit (HOSPITAL_COMMUNITY): Payer: Self-pay

## 2024-04-02 ENCOUNTER — Other Ambulatory Visit: Payer: Self-pay

## 2024-04-06 ENCOUNTER — Other Ambulatory Visit: Payer: Self-pay | Admitting: Pharmacy Technician

## 2024-04-06 ENCOUNTER — Other Ambulatory Visit: Payer: Self-pay

## 2024-04-06 NOTE — Progress Notes (Signed)
 Specialty Pharmacy Refill Coordination Note  Jeffrey Andrade is a 23 y.o. male contacted today regarding refills of specialty medication(s) Ustekinumab  (Stelara )   Patient requested [name] at [facility] at [place] date: 04/07/24   Medication will be filled on: 04/06/24

## 2024-04-16 ENCOUNTER — Other Ambulatory Visit: Payer: Self-pay

## 2024-04-21 ENCOUNTER — Other Ambulatory Visit: Payer: Self-pay

## 2024-04-21 NOTE — Progress Notes (Signed)
 Specialty Pharmacy Ongoing Clinical Assessment Note  Jeffrey Andrade is a 23 y.o. 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   Therapeutic benefit summary: Patient is achieving benefit   Adverse events/side effects summary: No adverse events/side effects   Patient's therapy is appropriate to: Continue    Goals Addressed             This Visit's Progress    Minimize recurrence of flares   On track    Patient is on track. Patient will maintain adherence         Follow up: 12 months  [facility]

## 2024-04-28 ENCOUNTER — Other Ambulatory Visit (HOSPITAL_COMMUNITY): Payer: Self-pay

## 2024-04-30 ENCOUNTER — Other Ambulatory Visit: Payer: Self-pay

## 2024-04-30 NOTE — Progress Notes (Signed)
 Specialty Pharmacy Refill Coordination Note  Jeffrey Andrade is a 23 y.o. male contacted today regarding refills of specialty medication(s) Ustekinumab  (Stelara )   Patient requested [name] at [facility] at [place] date: 05/02/24   Medication will be filled on: 05/01/24

## 2024-05-20 ENCOUNTER — Other Ambulatory Visit: Payer: Self-pay

## 2024-05-22 ENCOUNTER — Other Ambulatory Visit: Payer: Self-pay

## 2024-05-22 MED ORDER — USTEKINUMAB 90 MG/ML ~~LOC~~ SOSY
90.0000 mg | PREFILLED_SYRINGE | SUBCUTANEOUS | 5 refills | Status: AC
  Filled 2024-06-18 (×2): qty 1, 28d supply, fill #0
  Filled 2024-06-24: qty 2, 56d supply, fill #0
  Filled 2024-06-24: qty 2, 28d supply, fill #0
  Filled 2024-06-26: qty 2, 56d supply, fill #0
  Filled 2024-06-26: qty 1, 28d supply, fill #0
  Filled 2024-06-26 – 2024-06-29 (×2): qty 2, 56d supply, fill #0
  Filled 2024-06-29: qty 1, 28d supply, fill #0

## 2024-05-22 MED ORDER — USTEKINUMAB 90 MG/ML ~~LOC~~ SOSY
PREFILLED_SYRINGE | SUBCUTANEOUS | 5 refills | Status: AC
  Filled 2024-06-30: qty 1, 28d supply, fill #0

## 2024-05-25 ENCOUNTER — Other Ambulatory Visit: Payer: Self-pay

## 2024-05-25 ENCOUNTER — Other Ambulatory Visit (HOSPITAL_COMMUNITY): Payer: Self-pay

## 2024-05-25 NOTE — Progress Notes (Signed)
 Specialty Pharmacy Refill Coordination Note  Jeffrey Andrade is a 23 y.o. male contacted today regarding refills of specialty medication(s) Ustekinumab  (STELARA )   Patient requested [name] at [facility] at [place] date: 05/29/24   Medication will be filled on: 05/27/24

## 2024-05-27 ENCOUNTER — Other Ambulatory Visit (HOSPITAL_COMMUNITY): Payer: Self-pay

## 2024-05-27 ENCOUNTER — Other Ambulatory Visit: Payer: Self-pay

## 2024-05-28 ENCOUNTER — Other Ambulatory Visit (HOSPITAL_COMMUNITY): Payer: Self-pay

## 2024-05-29 ENCOUNTER — Other Ambulatory Visit (HOSPITAL_COMMUNITY): Payer: Self-pay

## 2024-05-29 ENCOUNTER — Other Ambulatory Visit: Payer: Self-pay

## 2024-05-29 NOTE — Progress Notes (Signed)
 Stelara  rang out at central and ready for patient pickup at [facility]. Spoke with Amberly at [facility] to alert that medication is already paid. Brand stelara  will no longer be covered on patient health plan, mom is aware of change.

## 2024-06-18 ENCOUNTER — Other Ambulatory Visit: Payer: Self-pay

## 2024-06-22 ENCOUNTER — Other Ambulatory Visit: Payer: Self-pay

## 2024-06-23 ENCOUNTER — Other Ambulatory Visit (HOSPITAL_COMMUNITY): Payer: Self-pay

## 2024-06-24 ENCOUNTER — Other Ambulatory Visit: Payer: Self-pay

## 2024-06-24 ENCOUNTER — Telehealth: Payer: Self-pay

## 2024-06-25 ENCOUNTER — Other Ambulatory Visit: Payer: Self-pay

## 2024-06-25 NOTE — Telephone Encounter (Addendum)
 Pharmacy Patient Advocate Encounter   Received notification from Physician's Office that prior authorization for Stelara  is required/requested.   Insurance verification completed.   The patient is insured through [facility].   Per test claim: PA required; PA submitted to above mentioned insurance via Latent Key/confirmation #/EOC [id number] Status is pending

## 2024-06-25 NOTE — Progress Notes (Signed)
 Spoke with patient's mom and she confirmed that patient only applied for medicaid while waiting for insurance to kick in with new employer. She will call me back with new plan information since it is not pulling up in wam

## 2024-06-26 ENCOUNTER — Other Ambulatory Visit: Payer: Self-pay

## 2024-06-26 ENCOUNTER — Other Ambulatory Visit (HOSPITAL_COMMUNITY): Payer: Self-pay

## 2024-06-29 ENCOUNTER — Other Ambulatory Visit (HOSPITAL_COMMUNITY): Payer: Self-pay

## 2024-06-29 ENCOUNTER — Other Ambulatory Visit: Payer: Self-pay

## 2024-06-30 ENCOUNTER — Other Ambulatory Visit: Payer: Self-pay | Admitting: Pharmacy Technician

## 2024-06-30 ENCOUNTER — Other Ambulatory Visit (HOSPITAL_COMMUNITY): Payer: Self-pay

## 2024-06-30 ENCOUNTER — Other Ambulatory Visit: Payer: Self-pay

## 2024-06-30 NOTE — Progress Notes (Signed)
 06/30/2024-Patient has new insurance and must use [employer] for refills. Patient has been notified and provided with contact information for [employer].
